# Patient Record
Sex: Female | Born: 1960 | Race: Black or African American | Hispanic: No | Marital: Single | Smoking: Never smoker
Health system: Southern US, Community
[De-identification: ages and names within clinical notes are randomized; demographics above are authoritative.]

## PROBLEM LIST (undated history)

## (undated) DIAGNOSIS — I639 Cerebral infarction, unspecified: Secondary | ICD-10-CM

## (undated) HISTORY — PX: OTHER SURGICAL HISTORY: SHX169

---

## 2004-04-06 ENCOUNTER — Ambulatory Visit: Payer: Self-pay | Admitting: Family Medicine

## 2004-04-06 ENCOUNTER — Inpatient Hospital Stay (HOSPITAL_COMMUNITY): Admission: AD | Admit: 2004-04-06 | Discharge: 2004-04-07 | Payer: Self-pay | Admitting: Gynecology

## 2004-04-24 ENCOUNTER — Other Ambulatory Visit: Admission: RE | Admit: 2004-04-24 | Discharge: 2004-04-24 | Payer: Self-pay | Admitting: Family Medicine

## 2004-04-24 ENCOUNTER — Ambulatory Visit: Payer: Self-pay | Admitting: Family Medicine

## 2004-04-24 ENCOUNTER — Encounter (INDEPENDENT_AMBULATORY_CARE_PROVIDER_SITE_OTHER): Payer: Self-pay | Admitting: Specialist

## 2004-05-04 ENCOUNTER — Inpatient Hospital Stay (HOSPITAL_COMMUNITY): Admission: AD | Admit: 2004-05-04 | Discharge: 2004-05-04 | Payer: Self-pay | Admitting: Family Medicine

## 2004-05-22 ENCOUNTER — Ambulatory Visit: Payer: Self-pay | Admitting: Family Medicine

## 2004-05-26 ENCOUNTER — Encounter: Admission: RE | Admit: 2004-05-26 | Discharge: 2004-05-26 | Payer: Self-pay | Admitting: Family Medicine

## 2004-06-27 ENCOUNTER — Ambulatory Visit: Payer: Self-pay | Admitting: *Deleted

## 2004-08-22 ENCOUNTER — Ambulatory Visit: Payer: Self-pay | Admitting: Obstetrics & Gynecology

## 2004-10-23 ENCOUNTER — Ambulatory Visit: Payer: Self-pay | Admitting: Obstetrics & Gynecology

## 2004-11-25 ENCOUNTER — Encounter: Admission: RE | Admit: 2004-11-25 | Discharge: 2004-11-25 | Payer: Self-pay | Admitting: Family Medicine

## 2004-11-25 ENCOUNTER — Inpatient Hospital Stay (HOSPITAL_COMMUNITY): Admission: AD | Admit: 2004-11-25 | Discharge: 2004-11-25 | Payer: Self-pay | Admitting: *Deleted

## 2004-12-11 ENCOUNTER — Ambulatory Visit: Payer: Self-pay | Admitting: Family Medicine

## 2005-01-06 ENCOUNTER — Ambulatory Visit: Payer: Self-pay | Admitting: Obstetrics and Gynecology

## 2005-03-19 ENCOUNTER — Emergency Department (HOSPITAL_COMMUNITY): Admission: EM | Admit: 2005-03-19 | Discharge: 2005-03-19 | Payer: Self-pay | Admitting: Emergency Medicine

## 2010-12-27 ENCOUNTER — Emergency Department (HOSPITAL_COMMUNITY): Payer: BC Managed Care – PPO

## 2010-12-27 ENCOUNTER — Inpatient Hospital Stay (HOSPITAL_COMMUNITY)
Admission: EM | Admit: 2010-12-27 | Discharge: 2010-12-28 | DRG: 034 | Disposition: A | Discharge status: Home / Self Care | Payer: BC Managed Care – PPO | Attending: Emergency Medicine | Admitting: Emergency Medicine

## 2010-12-27 DIAGNOSIS — N390 Urinary tract infection, site not specified: Secondary | ICD-10-CM | POA: Diagnosis present

## 2010-12-27 DIAGNOSIS — R0789 Other chest pain: Secondary | ICD-10-CM | POA: Diagnosis present

## 2010-12-27 DIAGNOSIS — E785 Hyperlipidemia, unspecified: Secondary | ICD-10-CM | POA: Diagnosis present

## 2010-12-27 DIAGNOSIS — R209 Unspecified disturbances of skin sensation: Principal | ICD-10-CM | POA: Diagnosis present

## 2010-12-27 LAB — COMPREHENSIVE METABOLIC PANEL
ALT: 10 U/L (ref 0–35)
AST: 17 U/L (ref 0–37)
Albumin: 3.4 g/dL — ABNORMAL LOW (ref 3.5–5.2)
Alkaline Phosphatase: 93 U/L (ref 39–117)
BUN: 10 mg/dL (ref 6–23)
CO2: 26 mEq/L (ref 19–32)
Calcium: 9.6 mg/dL (ref 8.4–10.5)
Chloride: 103 mEq/L (ref 96–112)
Creatinine, Ser: 0.5 mg/dL (ref 0.4–1.2)
GFR calc Af Amer: >60 mL/min (ref >60)
GFR calc non Af Amer: >60 mL/min (ref >60)
Glucose, Bld: 109 mg/dL — ABNORMAL HIGH (ref 70–99)
Potassium: 3.2 mEq/L — ABNORMAL LOW (ref 3.5–5.1)
Sodium: 138 mEq/L (ref 135–145)
Total Bilirubin: 0.3 mg/dL (ref 0.3–1.2)
Total Protein: 7.4 g/dL (ref 6.0–8.3)

## 2010-12-27 LAB — POCT CARDIAC MARKERS
CKMB, poc: 1.4 ng/mL (ref 1.0–8.0)
CKMB, poc: <1 ng/mL — ABNORMAL LOW (ref 1.0–8.0)
Myoglobin, poc: 70.9 ng/mL (ref 12–200)
Myoglobin, poc: 59.5 ng/mL (ref 12–200)
Troponin i, poc: <0.05 ng/mL (ref 0.00–0.09)
Troponin i, poc: <0.05 ng/mL (ref 0.00–0.09)

## 2010-12-27 LAB — HEPATIC FUNCTION PANEL
ALT: 11 U/L (ref 0–35)
AST: 19 U/L (ref 0–37)
Albumin: 3.5 g/dL (ref 3.5–5.2)
Alkaline Phosphatase: 97 U/L (ref 39–117)
Bilirubin, Direct: <0.1 mg/dL (ref 0.0–0.3)
Total Bilirubin: 0.2 mg/dL — ABNORMAL LOW (ref 0.3–1.2)
Total Protein: 7.7 g/dL (ref 6.0–8.3)

## 2010-12-27 LAB — URINALYSIS, ROUTINE W REFLEX MICROSCOPIC
Bilirubin Urine: NEGATIVE
Glucose, UA: NEGATIVE mg/dL
Ketones, ur: NEGATIVE mg/dL
Nitrite: NEGATIVE
Protein, ur: NEGATIVE mg/dL
Specific Gravity, Urine: 1.013 (ref 1.005–1.030)
Urobilinogen, UA: 0.2 mg/dL (ref 0.0–1.0)
pH: 6 (ref 5.0–8.0)

## 2010-12-27 LAB — POCT I-STAT, CHEM 8
BUN: 11 mg/dL (ref 6–23)
Calcium, Ion: 1.11 mmol/L — ABNORMAL LOW (ref 1.12–1.32)
Chloride: 109 mEq/L (ref 96–112)
Creatinine, Ser: 0.8 mg/dL (ref 0.4–1.2)
Glucose, Bld: 82 mg/dL (ref 70–99)
HCT: 35 % — ABNORMAL LOW (ref 36.0–46.0)
Hemoglobin: 11.9 g/dL — ABNORMAL LOW (ref 12.0–15.0)
Potassium: 3.6 mEq/L (ref 3.5–5.1)
Sodium: 141 mEq/L (ref 135–145)
TCO2: 24 mmol/L (ref 0–100)

## 2010-12-27 LAB — CBC
HCT: 35.8 % — ABNORMAL LOW (ref 36.0–46.0)
Hemoglobin: 12.1 g/dL (ref 12.0–15.0)
MCH: 29.5 pg (ref 26.0–34.0)
MCHC: 33.8 g/dL (ref 30.0–36.0)
MCV: 87.3 fL (ref 78.0–100.0)
Platelets: 390 10*3/uL (ref 150–400)
RBC: 4.1 MIL/uL (ref 3.87–5.11)
RDW: 11.7 % (ref 11.5–15.5)
WBC: 5.7 10*3/uL (ref 4.0–10.5)

## 2010-12-27 LAB — DIFFERENTIAL
Basophils Absolute: 0.1 10*3/uL (ref 0.0–0.1)
Basophils Relative: 1 % (ref 0–1)
Eosinophils Absolute: 0.1 10*3/uL (ref 0.0–0.7)
Eosinophils Relative: 1 % (ref 0–5)
Lymphocytes Relative: 44 % (ref 12–46)
Lymphs Abs: 2.5 10*3/uL (ref 0.7–4.0)
Monocytes Absolute: 0.3 10*3/uL (ref 0.1–1.0)
Monocytes Relative: 6 % (ref 3–12)
Neutro Abs: 2.7 10*3/uL (ref 1.7–7.7)
Neutrophils Relative %: 48 % (ref 43–77)

## 2010-12-27 LAB — URINE MICROSCOPIC-ADD ON

## 2010-12-27 LAB — PROTIME-INR
INR: 1.01 (ref 0.00–1.49)
Prothrombin Time: 13.5 seconds (ref 11.6–15.2)

## 2010-12-27 LAB — APTT: aPTT: 30 seconds (ref 24–37)

## 2010-12-27 LAB — CK TOTAL AND CKMB (NOT AT ARMC)
CK, MB: 2 ng/mL (ref 0.3–4.0)
Relative Index: 1.6 (ref 0.0–2.5)
Total CK: 126 U/L (ref 7–177)

## 2010-12-27 LAB — D-DIMER, QUANTITATIVE (NOT AT ARMC): D-Dimer, Quant: 0.91 ug/mL-FEU — ABNORMAL HIGH (ref 0.00–0.48)

## 2010-12-27 LAB — TROPONIN I: Troponin I: <0.3 ng/mL (ref <0.30)

## 2010-12-27 MED ORDER — IOHEXOL 300 MG/ML  SOLN
100.0000 mL | Freq: Once | INTRAMUSCULAR | Status: AC | PRN
  Administered 2010-12-27: 100 mL via INTRAVENOUS

## 2010-12-28 ENCOUNTER — Inpatient Hospital Stay (HOSPITAL_COMMUNITY): Payer: BC Managed Care – PPO

## 2010-12-28 LAB — BASIC METABOLIC PANEL
CO2: 26 mEq/L (ref 19–32)
Chloride: 106 mEq/L (ref 96–112)
GFR calc Af Amer: >60 mL/min (ref >60)
Potassium: 3.5 mEq/L (ref 3.5–5.1)
Sodium: 139 mEq/L (ref 135–145)

## 2010-12-28 LAB — LIPID PANEL
Cholesterol: 198 mg/dL (ref 0–200)
Total CHOL/HDL Ratio: 4.3 RATIO
Triglycerides: 106 mg/dL (ref <150)
VLDL: 21 mg/dL (ref 0–40)

## 2010-12-28 LAB — CBC
HCT: 33.7 % — ABNORMAL LOW (ref 36.0–46.0)
Hemoglobin: 11.4 g/dL — ABNORMAL LOW (ref 12.0–15.0)
MCH: 29.5 pg (ref 26.0–34.0)
MCHC: 33.8 g/dL (ref 30.0–36.0)
MCV: 87.3 fL (ref 78.0–100.0)

## 2010-12-29 LAB — URINE CULTURE
Colony Count: NO GROWTH
Culture  Setup Time: 201205061704
Culture: NO GROWTH

## 2010-12-29 NOTE — Consult Note (Signed)
NAMEHAROLDINE, [name]           ACCOUNT NO.:  [id number]  MEDICAL RECORD NO.:  [id number]           PATIENT TYPE:  I  LOCATION:  3021                         FACILITY:  [facility]  PHYSICIAN:  Levie Heritage, MD       DATE OF BIRTH:  03-06-1961  DATE OF CONSULTATION:  12/28/2010 DATE OF DISCHARGE:  12/28/2010                                CONSULTATION   REFERRING PHYSICIAN:  Mauro Kaufmann, MD of Primary Hospitalist Service.  REASON FOR CONSULTATION:  Questionable stroke.  CHIEF COMPLAINT:  Tingling and numbness at the distal extremities.  HISTORY OF PRESENT ILLNESS: 1. This patient is a 50 year old woman who presented to the emergency     yesterday after having chest pain.  She was complaining that she     also noticed tingling and numbness of all 4 distal extremities and     possibly of the face as well.  She denies any previous known     history of stroke. 2. CT scan of the head was performed that revealed a left-sided     caudate lacune which seems chronic in nature. 3. There are no active neurological deficits of the visual, motor,     sensory origin currently.  PAST MEDICAL HISTORY:  None except the benign cyst excision from both breast 3 times.  MEDICATIONS:  No regular medications at home but used to take occasional aspirin some time.  ALLERGIES:  No known drug allergies.  SOCIAL HISTORY:  The patient is a nonsmoker, nondrinker.  There is no history of illicit drug abuse.  FAMILY HISTORY:  Positive for coronary artery disease in both parents. Positive for hypertension in both parents.  Positive for diabetes disease in her maternal family.  Positive for cancer in her maternal family.  Breast cancer in maternal female cousin  REVIEW OF SYSTEMS:  Denies any chest pain.  Denies any shortness of breath.  Denies any recent weight loss.  No history of fevers, chills, nausea, vomiting, diarrhea.  No history of rashes.  No joint pains.  No visual changes or motor deficits.   No sensory issues.  Rest of 10 organ review of system unremarkable.  PHYSICAL EXAMINATION:  VITAL SIGNS:  Currently lying down comfortably in the bed with vitals 98 degrees Fahrenheit, pulse 75 per minute, breathing 20 per minute, and blood pressure 157/9 mmHg. NEUROLOGICAL:  The patient is awake, oriented x3 in no acute distress. Bilaterally pupils are reactive to light and accommodation.  Moves eyes to all direction.  There is no field cut noted.  Symmetrical face for sensation and strength testing.  Midline tongue without atrophy or fasciculation.  Shoulder shrug is intact 5/5.  Motor examination reveals 5/5 strength all over.  Sensory examination, admits to feel light touch all over symmetrically.  REVIEW OF CLINICAL DATA: 1. I have reviewed the patient's CT scan of the head that shows the     left caudate lacune of chronic nature and MRI and MRA of the brain     was performed after that which shows no acute stroke at all or any     intracranial vascular pathology. 2. I have reviewed her  lipid panel and have noted elevated LDL.IMPRESSION:  This patient is a 50 year old woman without any significant past medical history who had chest pain and tingling and numbness of all 4 extremities distally, which resolved. Her symptoms are very nonspecific and can be seen a variety of conditions, however, such symptoms do not correlate with the incidental finding of her left caudate remote lacune.  There is no acute stroke currently.  PLAN: 1. Please start the patient on 81 mg of daily aspirin. 2. Given the increased LDL, I suggest starting her on Zocor 20 mg at     bedtime. 3. If the tingling and numbness distally becomes an issue later on,     she would need to have an outpatient basis and nerve conduction     studies and EMG for evaluation/ruling out of the peripheral     neuropathic process.  This can be arranged by calling the number     [phone]. 4. There is no neurological  intervention planned as the inpatient. 5. I have discussed my impression in detail with the patient at the     bedside and also Dr. Sharl Ma of the primary hospitalist and will sign     off from the patient.  Please call me back if you have any     questions.          ______________________________ Levie Heritage, MD     WS/MEDQ  D:  12/28/2010  T:  12/28/2010  Job:  [id number]  Electronically Signed by Levie Heritage MD on 12/29/2010 12:07:54 PM

## 2011-01-03 NOTE — H&P (Signed)
[name], [name]           ACCOUNT NO.:  [id number]  MEDICAL RECORD NO.:  [id number]           PATIENT TYPE:  I  LOCATION:  3021                         FACILITY:  [facility]  PHYSICIAN:  Della Goo, M.D. DATE OF BIRTH:  11-Mar-1961  DATE OF ADMISSION:  12/27/2010 DATE OF DISCHARGE:                             HISTORY & PHYSICAL   PRIMARY CARE PHYSICIAN:  None.  CHIEF COMPLAINT:  Chest pain and right-sided numbness.  HISTORY OF PRESENT ILLNESS:  This is a 50 year old female who presents to the emergency department after being seen at an area urgent care center secondary to symptoms upon awakening of chest pain that has been occurring off and on all day.  The patient reports having throbbing pain in the left chest area that she can touch.  She also reports having right-sided weakness and numbness along with left-sided facial numbness. She reports that her legs feel heavy, however, this has been for a longtime.  The patient denies having any previous medical problems.  PAST MEDICAL HISTORY:  None.  PAST SURGICAL HISTORY:  History of benign cyst excisions in both breast x3.  MEDICATIONS:  No regular medications, aspirin on occasion.  ALLERGIES:  No known drug allergies.  SOCIAL HISTORY:  The patient is a nonsmoker, nondrinker.  No history of illicit drug usage.  FAMILY HISTORY:  Positive for coronary artery disease in both parents. Positive for hypertension in both parents.  Positive for diabetic disease in her maternal family.  Positive for cancers in her maternal family, breast cancers in maternal female cousins.  REVIEW OF SYSTEMS:  Pertinent ass mentioned above.  PHYSICAL EXAMINATION FINDINGS:  GENERAL:  This is a 50 year old well- nourished, well-developed African American female who is in no acute distress or discomfort at this time. VITAL SIGNS:  Temperature 98.8, blood pressure 126/90, heart rate 66, respirations 18, and O2 saturations 100%. HEENT:   Normocephalic and atraumatic.  There is no facial drooping. Pupils are equally round and reactive to light.  Extraocular movements are intact.  Funduscopic benign.  Nares are patent bilaterally. Oropharynx is clear.  Dentition is fair. NECK:  Supple.  Full range of motion.  No thyromegaly, adenopathy, or jugular venous distention. CARDIOVASCULAR:  Regular rate and rhythm.  No murmurs, gallops, or rubs appreciated. LUNGS:  Clear to auscultation bilaterally.  No rales, rhonchi, or wheezes. ABDOMEN:  Positive bowel sounds, soft, nontender, nondistended.  No hepatosplenomegaly. EXTREMITIES:  Without cyanosis, clubbing, or edema. NEUROLOGIC:  The patient is alert and oriented x3.  Her cranial nerves are intact.  Her speech is clear.  There is no facial drooping.  There is no pronator drifting.  Motor function is intact.  Sensation is also intact, however, the patient has subjective complaints of feeling numb. Gait has not been assessed.  LABORATORY STUDIES:  White blood cell count 5.7, hemoglobin 12.1, hematocrit 35.8, MCV 87.3, platelets 390, neutrophils 48%, lymphocytes 44%.  Sodium 141, potassium 3.6, chloride 109, CO2 of 24, BUN 11, creatinine 0.80, glucose 82, ionized calcium 1.11.  Point-of-care cardiac markers with a myoglobin of 70.9, CK-MB 1.4, troponin less than 0.05.  D-dimer 0.91.  Albumin 3.5, alkaline phosphatase 97, total  bilirubin 0.2, AST 19, ALT 11.  Point-of-care markers #2 with a myoglobin of 59.5, CK-MB less than 1.0, troponin less than 0.05.  CT scan of the head performed reveals a small lacunar infarction within the left putamen consistent with a subacute or chronic process.  No evidence of hemorrhage, mass lesion, or acute infarction.  CT angiogram of the chest performed, negative for evidence of pleural effusion or pericardial effusion and no suspicious pulmonary nodules and no evidence of pulmonary emboli.  EKG reveals a normal sinus rhythm.  No acute ST- segment  changes are, the rate is 65.  ASSESSMENT:  A 50 year old female being admitted with: 1. 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 2. Chest pain. 3. Paresthesias. 4. Mild hypocalcemia.  PLAN:  The patient will be admitted to a telemetry area for monitoring. Neurologic checks will be performed and the CVA protocol will be initiated as well.  The patient will also be evaluated for a cardiac rule out and cardiac enzymes will be performed.  Risk factors will be assessed for cardiovascular and cerebrovascular disease risk.  Fasting lipids will be ordered along with a TSH level.  The patient will be started on aspirin therapy.  DVT prophylaxis has been ordered and further workup will ensue pending results of the patient's clinical course and her studies.     Della Goo, M.D.     HJ/MEDQ  D:  12/27/2010  T:  12/28/2010  Job:  [id number]  Electronically Signed by Della Goo M.D. on 01/03/2011 07:49:16 PM

## 2011-01-09 NOTE — Group Therapy Note (Signed)
NAME:  [name], [name]                     ACCOUNT NO.:  [id number]   MEDICAL RECORD NO.:  [id number]                   PATIENT TYPE:  WOC   LOCATION:  [facility]                           FACILITY:  [facility]   PHYSICIAN:  Tinnie Gens, MD                     DATE OF BIRTH:  [birth year]   DATE OF SERVICE:  04/24/2004                                    CLINIC NOTE   CHIEF COMPLAINT:  Hospital follow-up.   HISTORY OF PRESENT ILLNESS:  The patient is a 50 year old gravida 3 para 3-0-  0-3 who was recently admitted to the hospital with anemia from fibroid  uterus.  At that time she received transfusion of 3 units of packed red  blood cells and was started on oral contraceptives.  Apparently the patient  has been taking them twice a day and reports that she still continues to  bleed.  She reports her bleeding continues to be heavy and she continues to  be somewhat orthostatic.   PAST MEDICAL HISTORY:  Significant for arthritis, history of pneumonia, and  breast disease.   SURGICAL HISTORY:  Negative.   MEDICATIONS:  Potassium and Ovcon-35.   ALLERGIES:  None known.   OBSTETRICAL HISTORY:  G3 P3, three SVDs.   GYNECOLOGICAL HISTORY:  No history of abnormal Pap.  Last mammogram was done  February 2003.   FAMILY HISTORY:  Significant for coronary artery disease, hypertension,  cancer, and blood clots.   SOCIAL HISTORY:  No tobacco, alcohol, or drug use.   REVIEW OF SYSTEMS:  A 14-point review of systems reviewed and is positive  for numbness and weakness in her fingers, muscle aches, dizzy spells,  problems with ears, problems hearing, problem with vision, problem with  breathing and shortness of breath, chest pain, and vaginal bleeding -  probably all related to her anemia.   PHYSICAL EXAMINATION TODAY:  VITAL SIGNS:  Her temperature was 98.6, pulse  74, blood pressure 126/76, weight is 120, height is 5 feet 1 inch.  GENERAL:  She is a small, well-developed, well-nourished  black female in no  acute distress.  ABDOMEN:  Soft, nontender, nondistended.  GENITOURINARY:  She has normal external female genitalia.  The vagina is  rugated.  There is a large amount of clot located in the vagina.  Procedure:  The cervix is cleaned with Betadine x3, grasped with a single tooth  tenaculum.  A Pipelle is used to sound the uterus which sounds to  approximately 12-week size.  The endometrial biopsy is taken without  difficulty.  The uterus is approximately 11- to 12-week size.  The adnexa  are within normal limits.   LABORATORY DATA:  Pertinent laboratories that are reviewed including pelvic  ultrasound which shows seven fibroids in multiple areas including one  pedunculated at the top.  The right ovary was seen, left ovary cannot be  seen.  Admission hemoglobin on August 14 was 6.6 with posttransfusion  9.0.  GC, chlamydia, and wet prep negative.   IMPRESSION:  1.  Fibroid uterus.  2.  Menorrhagia.  3.  Gynecological examination with Pap.  4.  Anemia.   PLAN:  1.  Will check CBC, TSH, FSH, LH today.  2.  Pap smear.  3.  Endometrial biopsy today.  4.  Depo-Provera 150 mg IM x1.  5.  Follow up in 4 weeks for posting pending biopsy results as well as labs.   The patient will return with heavy bleeding or syncope.                                               Tinnie Gens, MD    TP/MEDQ  D:  04/24/2004  T:  04/26/2004  Job:  [id number]

## 2011-01-09 NOTE — Group Therapy Note (Signed)
[name], [place] NO.:  [id number]   MEDICAL RECORD NO.:  [id number]          PATIENT TYPE:  WOC   LOCATION:  [facility]                   FACILITY:  [facility]   PHYSICIAN:  Tinnie Gens, MD        DATE OF BIRTH:  1961-05-20   DATE OF SERVICE:  12/11/2004                                    CLINIC NOTE   CHIEF COMPLAINT:  Bleeding.   HISTORY OF PRESENT ILLNESS:  The patient is a 50 year old gravida 3 para 3  who has been followed by [place] for fibroid uterus causing anemia and need for  transfusions who has failed medical therapy with oral contraceptives and  Depo-Provera. She was getting monthly Depo-Provera shots in hopes of holding  off surgery until the end of April or first of May; however, was seen in the  MAU on November 25, 2004 with increasing bleeding and lowered hemoglobin to 9.  At that point she got Lupron Depot which she was told would last her  approximately 3 months. She has not had any menopausal symptoms but is now  stating that she cannot have surgery or does not want to have surgery until  the end of July. She states she is building a house and cannot get it done  until then. However, she is very afraid of failing the Lupron Depot as well.   PHYSICAL EXAMINATION:  VITAL SIGNS:  As noted in the chart. Her weight is  141.5, blood pressure is 127/84, temperature is 100.  GENERAL:  She is a well-developed, well-nourished black female in no acute  distress.  ABDOMEN:  Soft, nontender, nondistended. She does have a probable 14-week-  size fibroid uterus that is firm noted at the lower portion of the abdomen.   IMPRESSION:  1.  Fibroid uterus.  2.  Menorrhagia.  3.  Anemia.  4.  Status post Lupron Depot.   PLAN:  The patient may have Lupron Depot again in 3 months that could  possibly hold her to surgery, but after that this is no longer an option to  her. This was all explained to the patient. The patient will return in 4  weeks to make sure that the  medication is holding. If it is not at that  point she will finally be prepared for surgery.      TP/MEDQ  D:  12/11/2004  T:  12/11/2004  Job:  [id number]

## 2011-01-09 NOTE — Discharge Summary (Signed)
NAME:  [name], [name]                     ACCOUNT NO.:  [id number]   MEDICAL RECORD NO.:  [id number]                   PATIENT TYPE:  INP   LOCATION:  9318                                 [facility]   PHYSICIAN:  Tanya S. Shawnie Pons, M.D.                DATE OF BIRTH:  Jul 16, 1961   DATE OF ADMISSION:  04/06/2004  DATE OF DISCHARGE:  04/07/2004                                 DISCHARGE SUMMARY   ADMISSION DIAGNOSIS:  A 50 year old G3, P3, with heavy vaginal bleeding.   DISCHARGE DIAGNOSES:  31. A 51 year old G3, P3, with resolved vaginal bleeding.  2. Symptomatic fibroids with symptomatic anemia, resolved.  3. Status post 3 packed units red blood cells.   DISCHARGE MEDICATIONS:  1. Ovcon-35, 1 p.o. b.i.d.  2. Iron sulfate 325, 1 p.o. b.i.d.   ADMISSION HISTORY:  [name] was admitted for heavy metrorrhagia.  She  was noted to have a hemoglobin of 6.6 on admission.  A pelvic ultrasound  showed multiple fibroids.  This was her first episode of bleeding in between  her menses.   HOSPITAL COURSE:  METRORRHAGIA:  The patient was transfused 3 units of  packed cells.  Her hemoglobin came up to 9.0.  Her bleeding subsided after  being placed on Ovcon oral contraceptive.  She was placed on IV fluids and  had some symptomatic orthostasis which was improved by time of discharge.   CONDITION ON DISCHARGE:  The patient was discharged to home in stable  condition.   INSTRUCTIONS:  The patient was told of her above medical regimen.  She is to  follow up with the gyn clinic in 1 week for treatment options.     Jon Gills, M.D.                     Shelbie Proctor. Shawnie Pons, M.D.    LC/MEDQ  D:  04/07/2004  T:  04/07/2004  Job:  [id number]

## 2011-01-09 NOTE — Group Therapy Note (Signed)
NAME:  [name], [name] NO.:  [id number]   MEDICAL RECORD NO.:  [id number]          PATIENT TYPE:  WOC   LOCATION:  [facility]                   FACILITY:  [facility]   PHYSICIAN:  Argentina Donovan, MD        DATE OF BIRTH:  [birth year]   DATE OF SERVICE:  01/06/2005                                    CLINIC NOTE   The patient is a 50 year old gravida 3, para 3-0-0-3, who was treated by Dr.  Shawnie Pons for anemia, who needs hysterectomy because of fibroids and who has  continued to bleed in spite of Depo-Provera and Lupron Depot.  She is trying  to put off her surgery until July because she has to move into a new home by  that time.  She also has a job that requires traveling and will be going to  [place] this weekend.  Her bleeding has picked up.  She will  have days when  she is not bleeding heavily; other days she has to use pads, tampons, and  chucks.  She has had transfusions in the past.  Her last hemoglobin was  around 9.  She cannot be hospitalized now.  D&C in the past has not worked.  I told her I really do not have much of an idea of how to control this for  the time she needs. We will try her on Ovcon twice a day to see if that will  work.  I told her that, when she gets back from [place], I want her to come  right in so we can evaluate her again and see what is going on because I am  not certain that her surgery can be put off until July.  We discussed the  risks of her possibly ending up in the emergency room in [facility] and needing  more transfusions, and she understands this is a problem.   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      PR/MEDQ  D:  01/06/2005  T:  01/07/2005  Job:  [id number]

## 2011-01-21 NOTE — Discharge Summary (Signed)
[name], [name]           ACCOUNT NO.:  [id number]  MEDICAL RECORD NO.:  [id number]           PATIENT TYPE:  I  LOCATION:  3021                         FACILITY:  [facility]  PHYSICIAN:  Mauro Kaufmann, MD         DATE OF BIRTH:  [birth year]  DATE OF ADMISSION:  12/27/2010 DATE OF DISCHARGE:  12/28/2010                              DISCHARGE SUMMARY   ADMISSION DIAGNOSES: 1. 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 2. Chest pain. 3. Paresthesia.  DISCHARGE DIAGNOSES: 1. Chest pain, resolved, cardiac enzymes negative. 2. Paresthesia.  The patient does have workup with EMG as outpatient. 3. No stroke as per MRI of the brain. 4. Questionable urinary tract infection.  The patient is started on     empiric antibiotics.  Urine cultures were relatively followed as     outpatient. 5 . Hyperlipidemia.  The patient is started on Zocor.  TESTS PERFORMED DURING HOSPITAL STAY:  Chest x-ray on Dec 24, 2010, showed no active cardiopulmonary disease.  CT of head without contrast showed subacute chronic lacunar infarct involving left putamen.  No evidence of acute intracranial abnormality.  CT angio of the chest was done which was negative.  No evidence of pulmonary embolism or other significant abnormality.  MRI of the brain showed no abnormality.  MRI of the brain was negative.  PERTINENT LABORATORY DATA:  The patient had elevated D-dimer of 0.91. The patient's CT angio of the chest was negative for any PE.  Lipid profile:  Cholesterol 198, triglycerides 106, cholesterol HDL 46, and LDL cholesterol is 131.  Hemoglobin A1c 5.8.  TSH of 2.614.  Troponin I x2 is negative.  BRIEF HISTORY AND PHYSICAL:  This is a 50 year old female who came to the hospital after she had symptom of chest pain that is occurring off and on all day.  It was throbbing in the left-sided chest area.  She also had right side numbness and weakness and the left side facial numbness.  The patient was  admitted with diagnosis of chest pain and also subacute CVA in the left putamen.  BRIEF HOSPITAL COURSE: 1. Questionable stroke:  The patient had MRI of the brain which showed     no stroke.  Neurology consultation was obtained and Neurology did     not think that the patient had a stroke.  They wanted to send the     patient home on aspirin 81 mg p.o. daily along with Zocor.  The     patient will have the EMG study done as outpatient and will follow     with the [name]. 2. Hyperlipidemia:  The patient was found to have high LDL and the     patient has been started on Zocor 20 mg p.o. daily. 3. Questionable UTI:  The patient's UA was abnormal during the     admission with moderate leukocytes and trace blood.  The patient     has been started empirically on the ciprofloxacin.  The urine     culture has been pending at this time.  The patient's urine culture     can be followed  as outpatient.  MEDICATION ON DISCHARGE: 1. Aspirin 81 mg p.o. daily. 2. Cipro 500 p.o. b.i.d. for 5 days. 3. Zocor 20 mg p.o. daily. 4. Natural potassium, the patient takes as outpatient.  The patient's chest pain is atypical.  The patient has negative cardiac enzymes and EKG was normal.  So, the patient will be discharged and has been having atypical chest pain.     Mauro Kaufmann, MD     GL/MEDQ  D:  12/28/2010  T:  12/28/2010  Job:  [id number]  cc:   Della Goo, M.D. Levie Heritage, MD  Electronically Signed by Mauro Kaufmann  on 01/21/2011 12:07:02 PM

## 2011-07-08 ENCOUNTER — Emergency Department (HOSPITAL_COMMUNITY): Payer: BC Managed Care – PPO

## 2011-07-08 ENCOUNTER — Emergency Department (HOSPITAL_COMMUNITY)
Admission: EM | Admit: 2011-07-08 | Discharge: 2011-07-08 | Disposition: A | Discharge status: Home / Self Care | Payer: BC Managed Care – PPO | Attending: Emergency Medicine | Admitting: Emergency Medicine

## 2011-07-08 ENCOUNTER — Encounter: Payer: Self-pay | Admitting: Emergency Medicine

## 2011-07-08 DIAGNOSIS — M25552 Pain in left hip: Secondary | ICD-10-CM

## 2011-07-08 DIAGNOSIS — Z8673 Personal history of transient ischemic attack (TIA), and cerebral infarction without residual deficits: Secondary | ICD-10-CM | POA: Insufficient documentation

## 2011-07-08 DIAGNOSIS — Z79899 Other long term (current) drug therapy: Secondary | ICD-10-CM | POA: Insufficient documentation

## 2011-07-08 DIAGNOSIS — M79609 Pain in unspecified limb: Secondary | ICD-10-CM | POA: Insufficient documentation

## 2011-07-08 DIAGNOSIS — Z7982 Long term (current) use of aspirin: Secondary | ICD-10-CM | POA: Insufficient documentation

## 2011-07-08 DIAGNOSIS — M25559 Pain in unspecified hip: Secondary | ICD-10-CM | POA: Insufficient documentation

## 2011-07-08 HISTORY — DX: Cerebral infarction, unspecified: I63.9

## 2011-07-08 MED ORDER — NAPROXEN 500 MG PO TABS: 500.0000 mg | ORAL_TABLET | Freq: Two times a day (BID) | ORAL | Status: AC

## 2011-07-08 MED ORDER — HYDROCODONE-ACETAMINOPHEN 5-325 MG PO TABS: 1.0000 | ORAL_TABLET | ORAL | Status: AC | PRN

## 2011-07-08 MED ORDER — PREDNISONE 50 MG PO TABS: 50.0000 mg | ORAL_TABLET | Freq: Every day | ORAL | Status: AC

## 2011-07-08 MED ORDER — KETOROLAC TROMETHAMINE 60 MG/2ML IM SOLN
60.0000 mg | Freq: Once | INTRAMUSCULAR | Status: AC
  Administered 2011-07-08: 60 mg via INTRAMUSCULAR
  Filled 2011-07-08: qty 2

## 2011-07-08 NOTE — ED Provider Notes (Signed)
History     CSN: [id number] Arrival date & time: 07/08/2011  3:26 AM   First MD Initiated Contact with Patient 07/08/11 0531      Chief Complaint  Patient presents with  . Leg Pain    (Consider location/radiation/quality/duration/timing/severity/associated sxs/prior treatment) HPI.... complains of pain in left lateral hip for approximately 2 days. No trauma. No radiation. Pain is sharp. Movement makes it worse. Sitting still makes it better.  Has never had similar pain in the past  Past Medical History  Diagnosis Date  . Stroke     Past Surgical History  Procedure Date  . Cyst removed from breast      Family History  Problem Relation Age of Onset  . Cancer Mother   . Hypertension Mother   . Hypertension Father     History  Substance Use Topics  . Smoking status: Never Smoker   . Smokeless tobacco: Not on file  . Alcohol Use: No    OB History    Grav Para Term Preterm Abortions TAB SAB Ect Mult Living                  Review of Systems  All other systems reviewed and are negative.    Allergies  Review of patient's allergies indicates no known allergies.  Home Medications   Current Outpatient Rx  Name Route Sig Dispense Refill  . ASPIRIN 81 MG PO TABS Oral Take 81 mg by mouth daily.      Marland Kitchen HYDROCODONE-ACETAMINOPHEN 5-325 MG PO TABS Oral Take 1-2 tablets by mouth every 4 (four) hours as needed for pain. 20 tablet 0  . NAPROXEN 500 MG PO TABS Oral Take 1 tablet (500 mg total) by mouth 2 (two) times daily. 30 tablet 0  . PREDNISONE 50 MG PO TABS Oral Take 1 tablet (50 mg total) by mouth daily. 7 tablet 1    BP 94/80  Pulse 76  Temp(Src) 97.8 F (36.6 C) (Oral)  Resp 18  SpO2 100%  Physical Exam  Nursing note and vitals reviewed. Constitutional: She is oriented to person, place, and time. She appears well-developed and well-nourished.  HENT:  Head: Normocephalic and atraumatic.  Eyes: Conjunctivae and EOM are normal. Pupils are equal, round, and  reactive to light.  Neck: Normal range of motion. Neck supple.  Cardiovascular: Normal rate and regular rhythm.   Pulmonary/Chest: Effort normal and breath sounds normal.  Abdominal: Soft. Bowel sounds are normal.  Musculoskeletal:       Minimal tenderness left lateral hip. Pain with range of motion at hip.  Neurovascular intact distal  Neurological: She is alert and oriented to person, place, and time.  Skin: Skin is warm and dry.  Psychiatric: She has a normal mood and affect.    ED Course  Procedures (including critical care time)  Labs Reviewed - No data to display Dg Hip Complete Left  07/08/2011  *RADIOLOGY REPORT*  Clinical Data: Sudden onset of left hip and leg pain 2 days ago.  LEFT HIP - COMPLETE 2+ VIEW  Comparison: None.  Findings: There is no evidence of fracture or dislocation.  Both femoral heads are seated normally within their respective acetabula.  No significant degenerative change is appreciated.  The proximal left femur appears intact.  The sacroiliac joints are unremarkable in appearance.  The visualized bowel gas pattern is grossly unremarkable in appearance.  Scattered phleboliths are noted within the pelvis.  IMPRESSION: No evidence of fracture or dislocation.  Original Report Authenticated By:  JEFFREY CHANG, M.D.     1. Pain in left hip       MDM  Plain films of left hip were normal. Suspect bursitis as cause of pain. Will Rx pain medication, prednisone, Naprosyn.  Follow up with orthopedic better        Donnetta Hutching, MD 07/08/11 [phone]

## 2011-07-08 NOTE — ED Notes (Signed)
Pt states yesterday she started having pain in her left hip and then her leg and now it hurts to walk  Denies injury

## 2011-07-08 NOTE — ED Notes (Signed)
To x-ray and returned-assisted to bathroom via wheelchair

## 2011-12-31 ENCOUNTER — Other Ambulatory Visit: Payer: Self-pay | Admitting: Family Medicine

## 2011-12-31 DIAGNOSIS — R102 Pelvic and perineal pain: Secondary | ICD-10-CM

## 2012-01-01 ENCOUNTER — Ambulatory Visit
Admission: RE | Admit: 2012-01-01 | Discharge: 2012-01-01 | Disposition: A | Discharge status: Home / Self Care | Payer: BC Managed Care – PPO | Source: Ambulatory Visit | Attending: Family Medicine | Admitting: Family Medicine

## 2012-01-01 DIAGNOSIS — R102 Pelvic and perineal pain: Secondary | ICD-10-CM

## 2012-06-12 IMAGING — CR DG CHEST 2V
2 series · 2 of 2 positions shown · non-contrast
Comparison: 03/19/2005

CLINICAL DATA: Chest pain.  Weakness.

CHEST - 2 VIEW

[w chest pa]
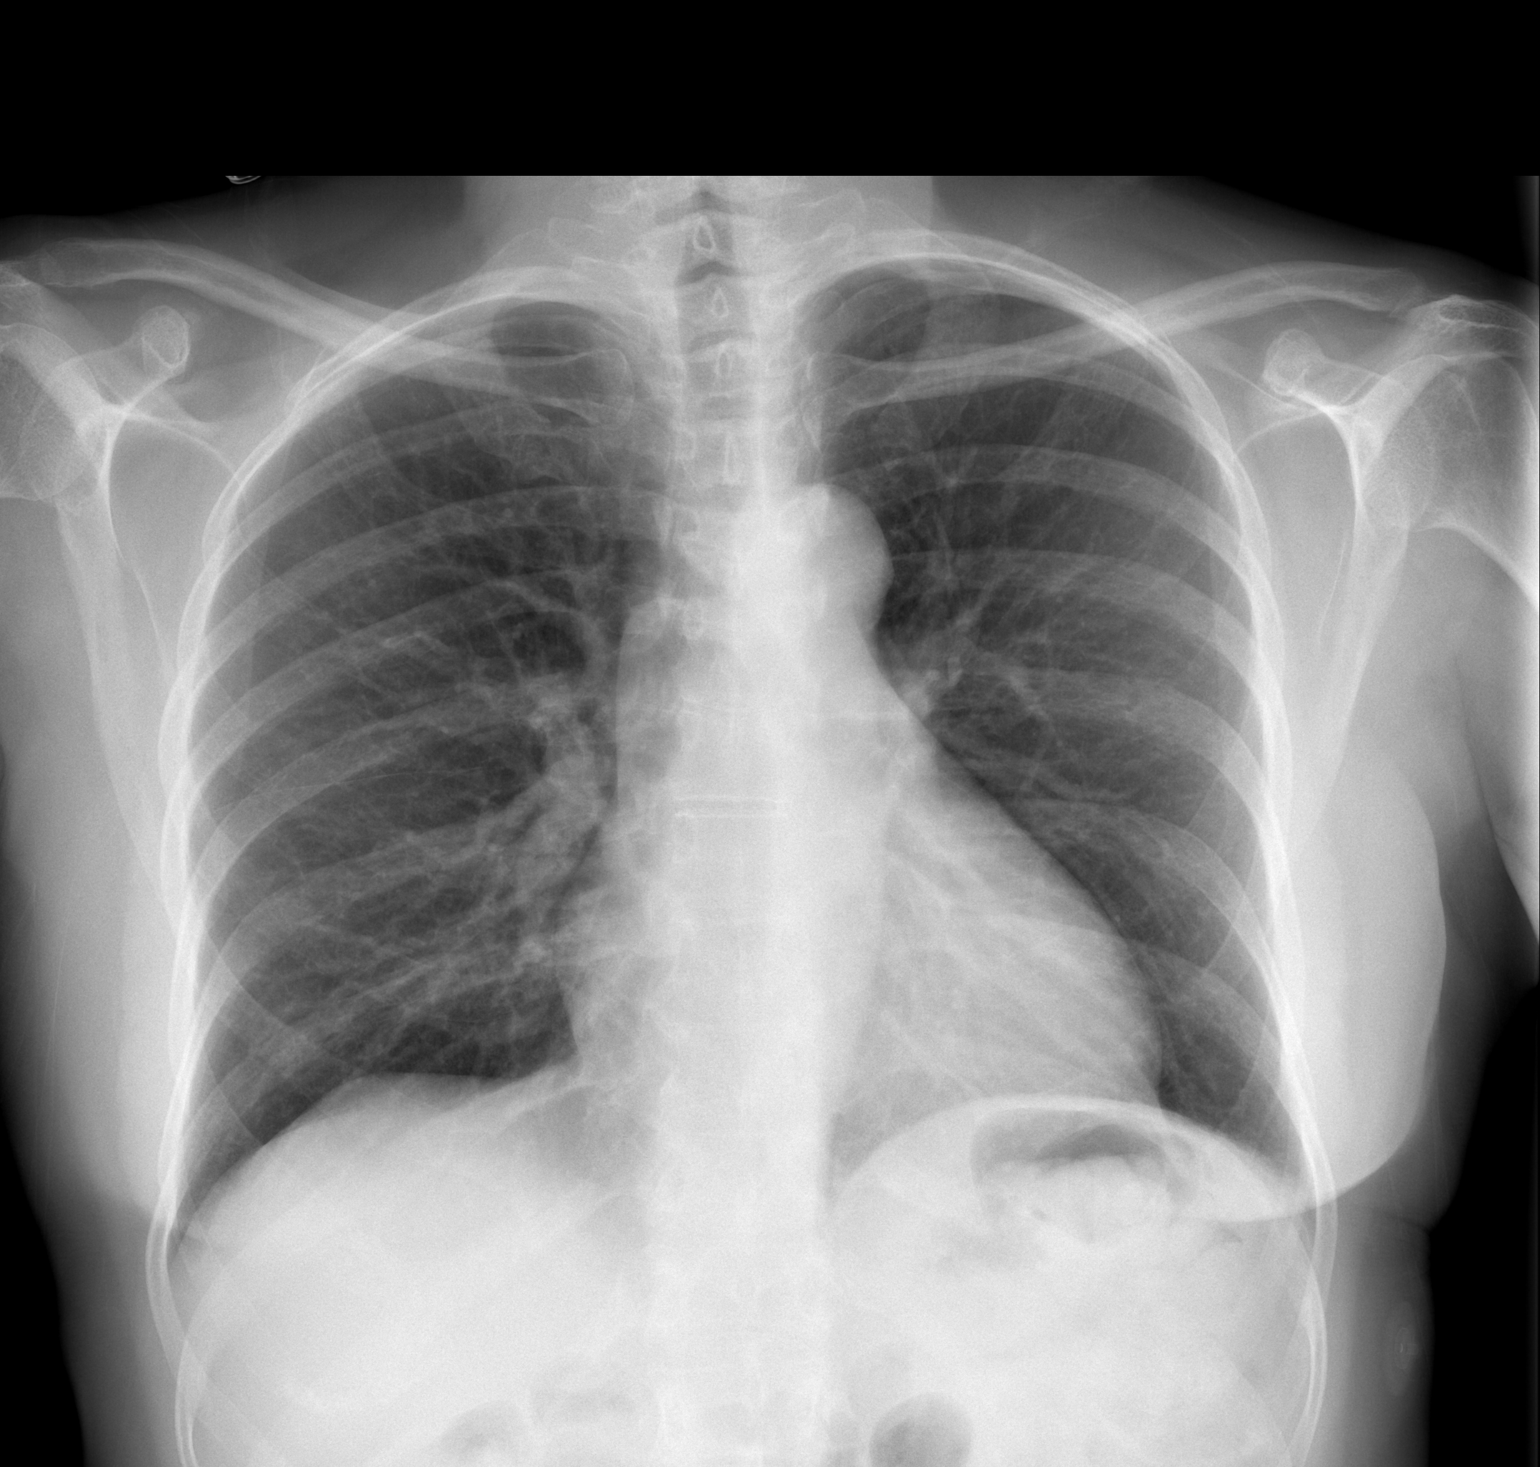

[w chest lat]
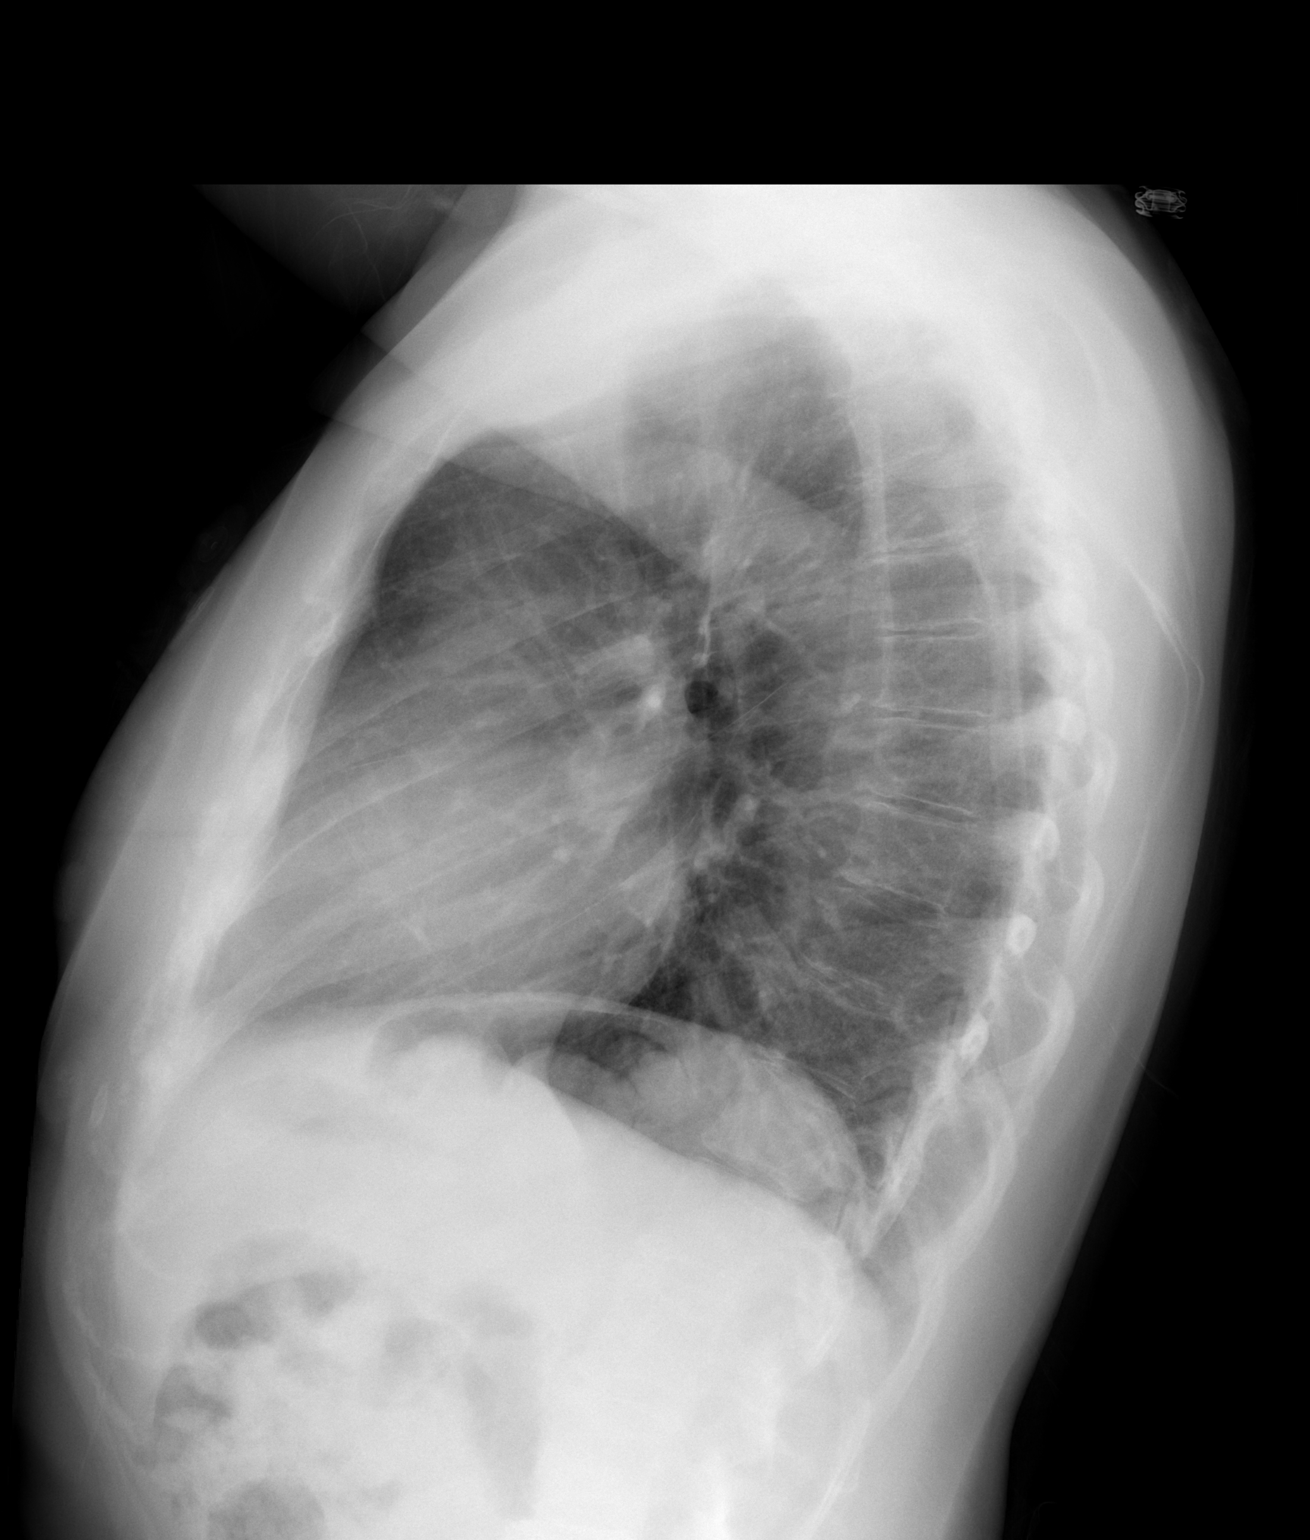

[2 of 2 positions shown; findings below may reference images not displayed]

FINDINGS: The heart size and mediastinal contours are within
normal limits.  Both lungs are clear.  The visualized skeletal
structures are unremarkable.
IMPRESSION: No active cardiopulmonary disease.

## 2012-06-12 IMAGING — CT CT HEAD W/O CM
1 series · 16 of 30 positions shown, 20 images · non-contrast
Comparison: None.

CLINICAL DATA: Chest pain, weakness.  Dizziness and blurred vision.
Numbness on the right side of face.  Numbness and feet and hands.

CT HEAD WITHOUT CONTRAST
TECHNIQUE: Contiguous axial images were obtained from the base of
the skull through the vertex without contrast.

[Series 2: head routine 4.8 h37s · axial · 0.45mm/px · z∈[+1033,+1161]mm · 16 of 30 slices shown, 20 images]
[im 2/30  brain]
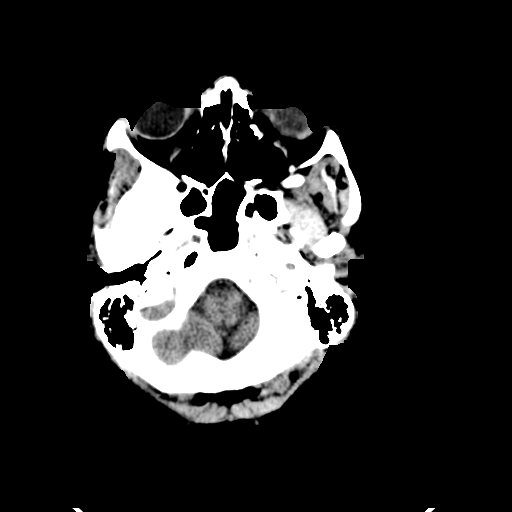
[im 2/30  bone]
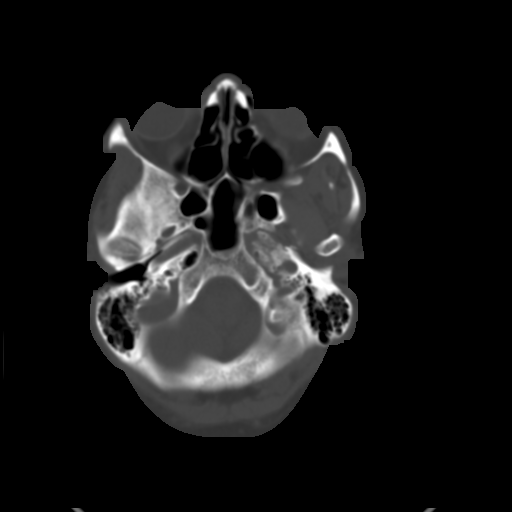
[im 4/30  brain]
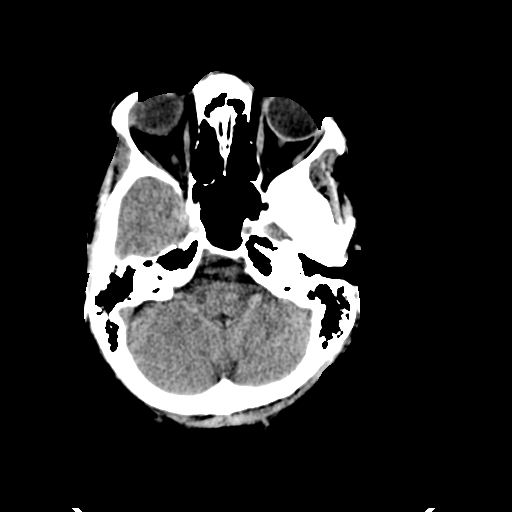
[im 6/30  brain]
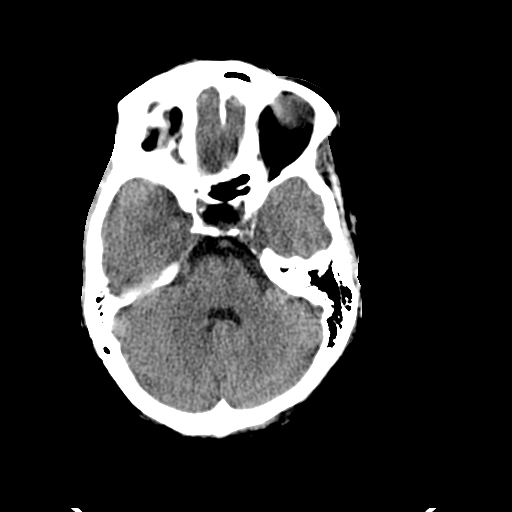
[im 8/30  brain]
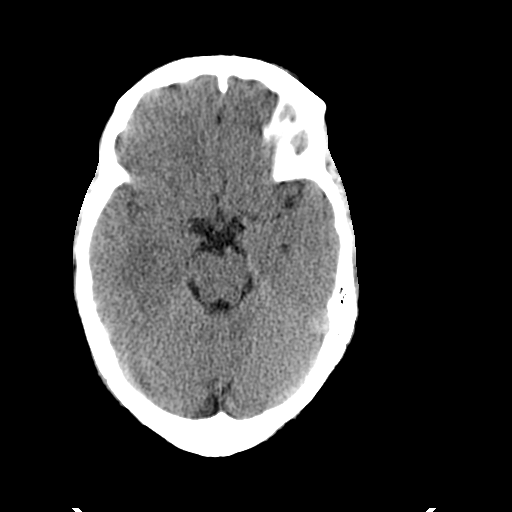
[im 9/30  brain]
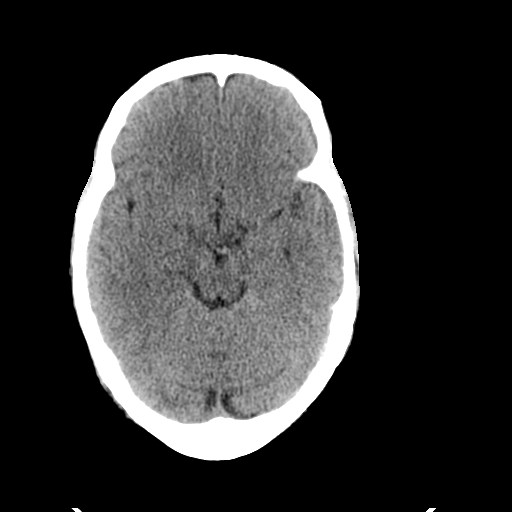
[im 9/30  bone]
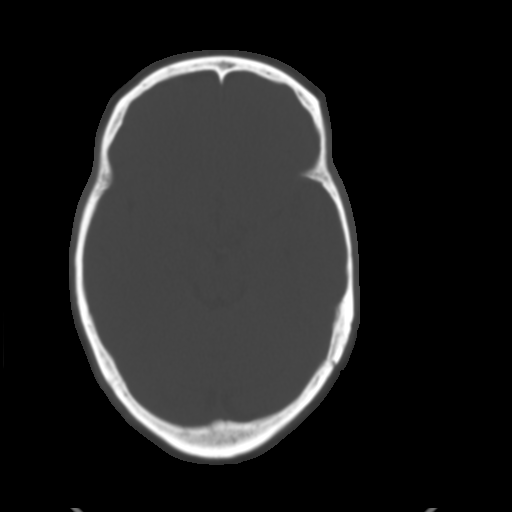
[im 11/30  brain]
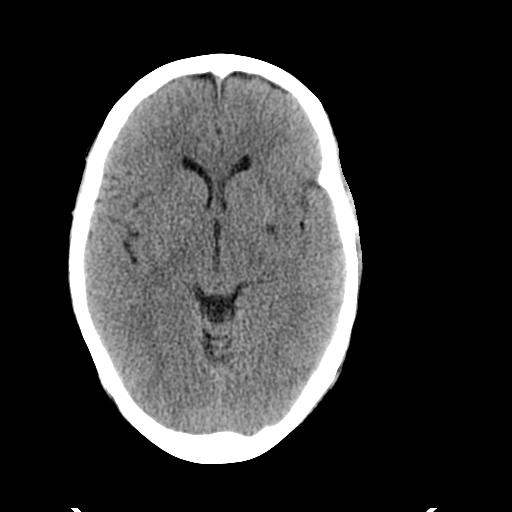
[im 13/30  brain]
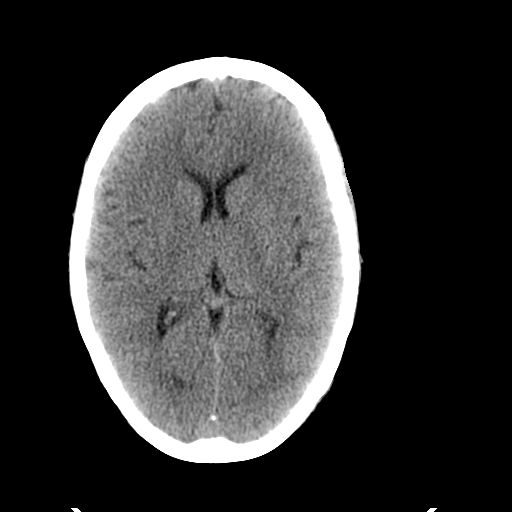
[im 15/30  brain]
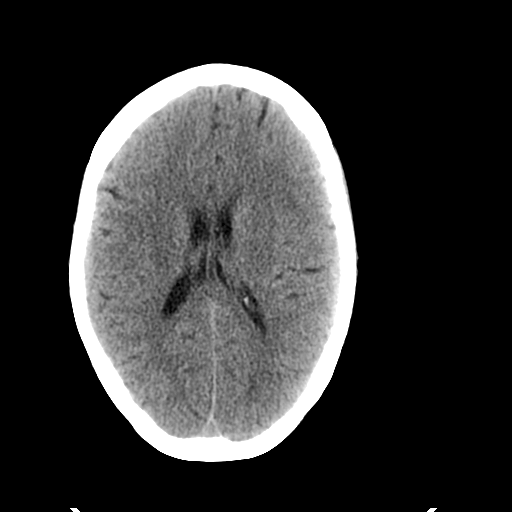
[im 16/30  brain]
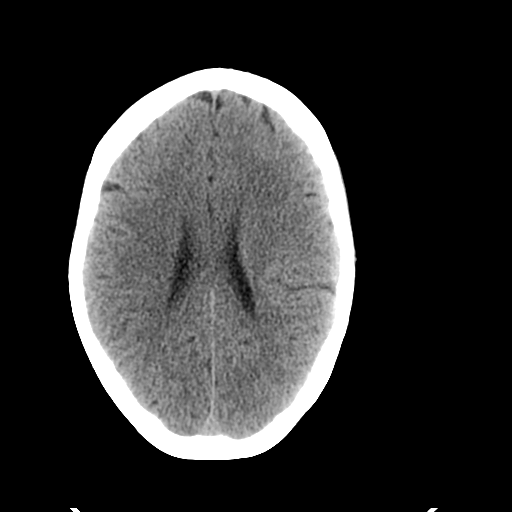
[im 16/30  bone]
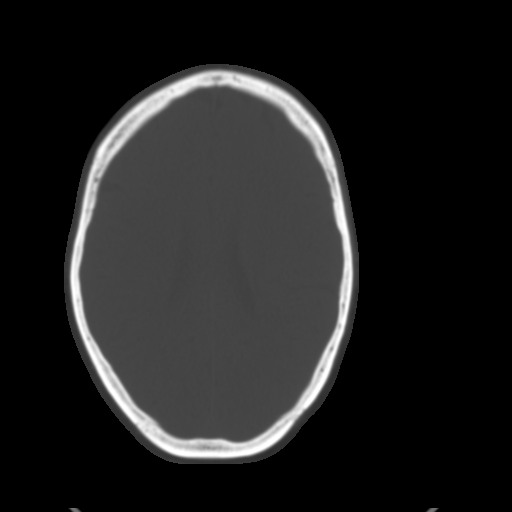
[im 18/30  brain]
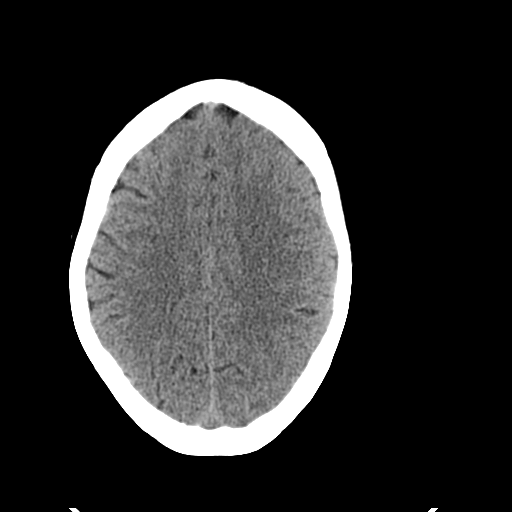
[im 20/30  brain]
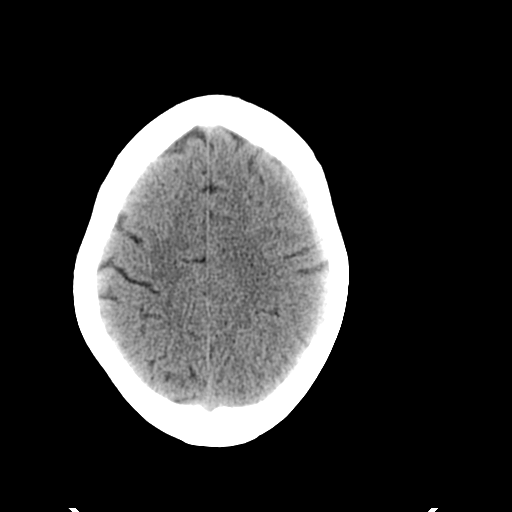
[im 22/30  brain]
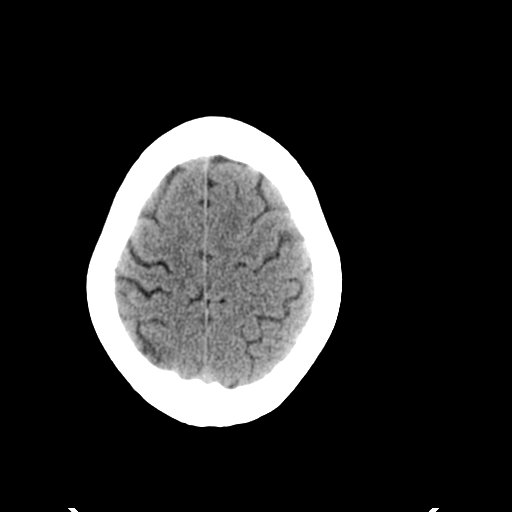
[im 23/30  brain]
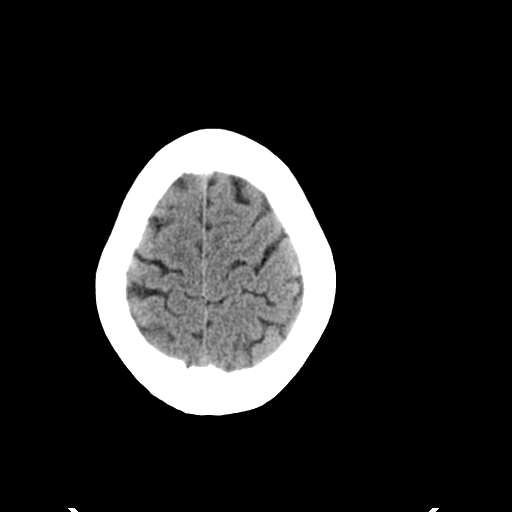
[im 23/30  bone]
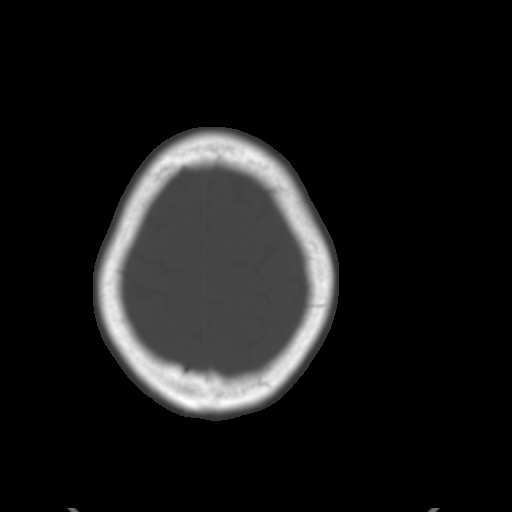
[im 25/30  brain]
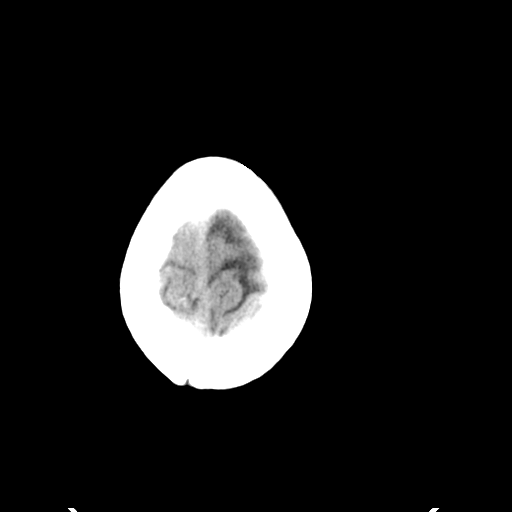
[im 27/30  brain]
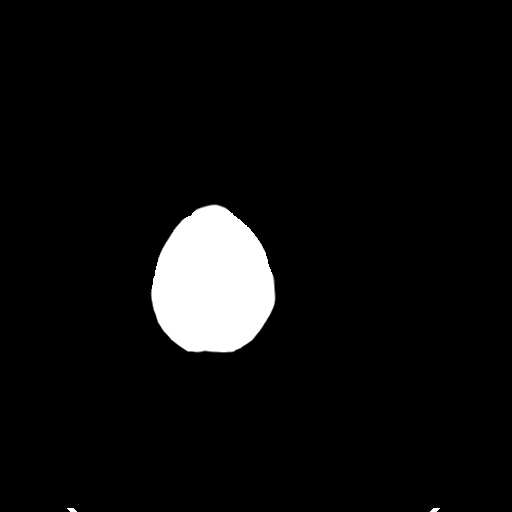
[im 29/30  brain]
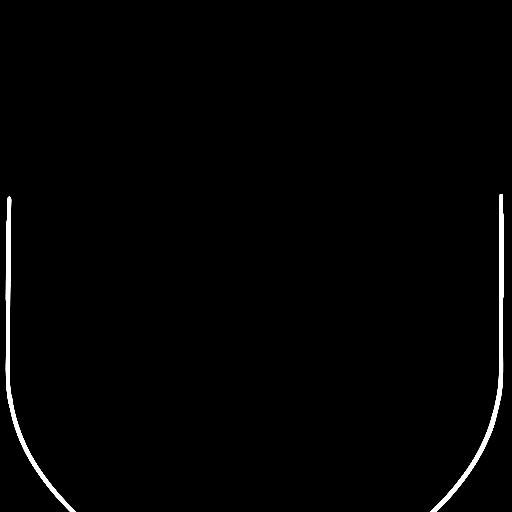

[16 of 30 positions shown; findings below may reference images not displayed]

FINDINGS: A small lacunar infarction is identified within the left
putamen.  Findings favor a subacute or chronic process. There is no
evidence for hemorrhage, mass lesion, or acute infarction.  Bone
windows are unremarkable.
IMPRESSION: 1.  Subacute or chronic lacunar infarct involving the left putamen
2. No evidence for acute intracranial abnormality.

## 2015-03-24 ENCOUNTER — Encounter (HOSPITAL_COMMUNITY): Payer: Self-pay

## 2015-03-24 ENCOUNTER — Emergency Department (HOSPITAL_COMMUNITY)
Admission: EM | Admit: 2015-03-24 | Discharge: 2015-03-25 | Disposition: A | Discharge status: Home / Self Care | Payer: Self-pay | Attending: Emergency Medicine | Admitting: Emergency Medicine

## 2015-03-24 DIAGNOSIS — R112 Nausea with vomiting, unspecified: Secondary | ICD-10-CM | POA: Insufficient documentation

## 2015-03-24 DIAGNOSIS — R197 Diarrhea, unspecified: Secondary | ICD-10-CM | POA: Insufficient documentation

## 2015-03-24 DIAGNOSIS — Z7982 Long term (current) use of aspirin: Secondary | ICD-10-CM | POA: Insufficient documentation

## 2015-03-24 DIAGNOSIS — R1084 Generalized abdominal pain: Secondary | ICD-10-CM | POA: Insufficient documentation

## 2015-03-24 LAB — CBC
HEMATOCRIT: 37.8 % (ref 36.0–46.0)
HEMOGLOBIN: 12.8 g/dL (ref 12.0–15.0)
MCH: 29.9 pg (ref 26.0–34.0)
MCHC: 33.9 g/dL (ref 30.0–36.0)
MCV: 88.3 fL (ref 78.0–100.0)
Platelets: 321 10*3/uL (ref 150–400)
RBC: 4.28 MIL/uL (ref 3.87–5.11)
RDW: 12.3 % (ref 11.5–15.5)
WBC: 15.6 10*3/uL — AB (ref 4.0–10.5)

## 2015-03-24 LAB — COMPREHENSIVE METABOLIC PANEL
ALK PHOS: 88 U/L (ref 38–126)
ALT: 16 U/L (ref 14–54)
ANION GAP: 13 (ref 5–15)
AST: 22 U/L (ref 15–41)
Albumin: 3.8 g/dL (ref 3.5–5.0)
BILIRUBIN TOTAL: 0.5 mg/dL (ref 0.3–1.2)
BUN: 14 mg/dL (ref 6–20)
CHLORIDE: 106 mmol/L (ref 101–111)
CO2: 22 mmol/L (ref 22–32)
CREATININE: 0.73 mg/dL (ref 0.44–1.00)
Calcium: 9.6 mg/dL (ref 8.9–10.3)
GFR calc non Af Amer: >60 mL/min (ref >60)
GLUCOSE: 155 mg/dL — AB (ref 65–99)
POTASSIUM: 3.8 mmol/L (ref 3.5–5.1)
Sodium: 141 mmol/L (ref 135–145)
Total Protein: 7.1 g/dL (ref 6.5–8.1)

## 2015-03-24 LAB — LIPASE, BLOOD: LIPASE: 24 U/L (ref 22–51)

## 2015-03-24 MED ORDER — ONDANSETRON 4 MG PO TBDP
ORAL_TABLET | ORAL | Status: AC
  Filled 2015-03-24: qty 1

## 2015-03-24 MED ORDER — ONDANSETRON 4 MG PO TBDP
4.0000 mg | ORAL_TABLET | Freq: Once | ORAL | Status: AC | PRN
  Administered 2015-03-24: 4 mg via ORAL

## 2015-03-24 NOTE — ED Provider Notes (Signed)
CSN: [id number]     Arrival date & time 03/24/15  2147 History  This chart was scribed for Mirian Mo, MD by Doreatha Martin, ED Scribe. This patient was seen in room A11C/A11C and the patient's care was started at 12:40 AM.     Chief Complaint  Patient presents with  . Abdominal Pain   Patient is a 54 y.o. female presenting with abdominal pain. The history is provided by the patient. No language interpreter was used.  Abdominal Pain Pain location:  LLQ, RLQ and suprapubic Pain quality: cramping   Pain radiates to:  Does not radiate Pain severity:  Moderate Onset quality:  Gradual Duration:  6 hours Progression:  Improving Context: suspicious food intake   Relieved by:  Vomiting and bowel activity Worsened by:  Nothing tried Ineffective treatments:  None tried Associated symptoms: diarrhea, nausea and vomiting   Associated symptoms: no constipation     HPI Comments: [name] is a 54 y.o. female who presents to the Emergency Department complaining of moderate, gradually improving RLQ, LLQ, central abdominal pain onset 6 hours PTA. She states associated nausea, vomiting, diarrhea. Pt states that she had no symptoms before 1700 today. She notes that she felt discomfort initially and was worsened after eating. She states she has never had this pain before. She denies constipation, current nausea.    History reviewed. No pertinent past medical history. Past Surgical History  Procedure Laterality Date  . Cyst removed from breast      Family History  Problem Relation Age of Onset  . Cancer Mother   . Hypertension Mother   . Hypertension Father    History  Substance Use Topics  . Smoking status: Never Smoker   . Smokeless tobacco: Not on file  . Alcohol Use: No   OB History    No data available     Review of Systems  Gastrointestinal: Positive for nausea, vomiting, abdominal pain and diarrhea. Negative for constipation.  All other systems reviewed and are  negative.   Allergies  Review of patient's allergies indicates no known allergies.  Home Medications   Prior to Admission medications   Medication Sig Start Date End Date Taking? Authorizing Provider  aspirin 81 MG tablet Take 81 mg by mouth daily.      Historical Provider, MD   BP 124/100 mmHg  Pulse 72  Temp(Src) 98.2 F (36.8 C) (Oral)  Resp 18  SpO2 100% Physical Exam  Constitutional: She is oriented to person, place, and time. She appears well-developed and well-nourished.  HENT:  Head: Normocephalic and atraumatic.  Right Ear: External ear normal.  Left Ear: External ear normal.  Eyes: Conjunctivae and EOM are normal. Pupils are equal, round, and reactive to light.  Neck: Normal range of motion. Neck supple.  Cardiovascular: Normal rate, regular rhythm, normal heart sounds and intact distal pulses.   Pulmonary/Chest: Effort normal and breath sounds normal.  Abdominal: Soft. Bowel sounds are normal. There is generalized tenderness.  Musculoskeletal: Normal range of motion.  Neurological: She is alert and oriented to person, place, and time.  Skin: Skin is warm and dry.  Vitals reviewed.   ED Course  Procedures (including critical care time) DIAGNOSTIC STUDIES: Oxygen Saturation is 100% on RA, normal by my interpretation.    COORDINATION OF CARE: 12:48 AM Discussed treatment plan with pt at bedside and pt agreed to plan.  Labs Review Labs Reviewed  COMPREHENSIVE METABOLIC PANEL - Abnormal; Notable for the following:    Glucose, Bld  155 (*)    All other components within normal limits  CBC - Abnormal; Notable for the following:    WBC 15.6 (*)    All other components within normal limits  LIPASE, BLOOD  URINALYSIS, ROUTINE W REFLEX MICROSCOPIC (NOT AT [facility])    Imaging Review No results found.   EKG Interpretation None      MDM   Final diagnoses:  None    54 y.o. female without pertinent PMH presents with crampy abd pain, nausea, vomiting, and  diarrhea as above after eating a salad from [employer].  On arrival vital signs and physical exam as above. Patient is well-appearing, had relief of pain after time, without medication. She was given Zofran with further relief of symptoms. Patient has generalized mild abdominal tenderness. Workup unremarkable. Likely foodborne illness versus viral gastritis. Discussed strict home in stable condition with standard return precautions.    I have reviewed all laboratory and imaging studies if ordered as above  1. Generalized abdominal pain   2. Non-intractable vomiting with nausea, vomiting of unspecified type   3. Diarrhea          Mirian Mo, MD 03/25/15 [phone]

## 2015-03-24 NOTE — ED Notes (Signed)
Pt here for right, middle and left lower abdominal pain, onset around 6pm tonight. Pt endorses vomiting and diarrhea x5. Pt appears very uncomfortable at triage, moaning and reports severe pain.

## 2015-03-25 ENCOUNTER — Encounter (HOSPITAL_COMMUNITY): Payer: Self-pay | Admitting: Emergency Medicine

## 2015-03-25 MED ORDER — ONDANSETRON 4 MG PO TBDP: ORAL_TABLET | ORAL | Status: AC

## 2015-03-25 NOTE — Discharge Instructions (Signed)
